# Patient Record
Sex: Female | Born: 1990 | Race: Black or African American | Hispanic: No | Marital: Single | Smoking: Never smoker
Health system: Southern US, Community
[De-identification: ages and names within clinical notes are randomized; demographics above are authoritative.]

## PROBLEM LIST (undated history)

## (undated) ENCOUNTER — Emergency Department (HOSPITAL_COMMUNITY): Payer: BC Managed Care – PPO

## (undated) DIAGNOSIS — K859 Acute pancreatitis without necrosis or infection, unspecified: Secondary | ICD-10-CM

## (undated) DIAGNOSIS — J45909 Unspecified asthma, uncomplicated: Secondary | ICD-10-CM

---

## 2019-02-10 ENCOUNTER — Encounter (HOSPITAL_COMMUNITY): Payer: Self-pay

## 2019-02-10 ENCOUNTER — Other Ambulatory Visit: Payer: Self-pay

## 2019-02-10 ENCOUNTER — Emergency Department (HOSPITAL_COMMUNITY): Payer: Managed Care, Other (non HMO)

## 2019-02-10 DIAGNOSIS — R002 Palpitations: Secondary | ICD-10-CM | POA: Diagnosis present

## 2019-02-10 DIAGNOSIS — Z79899 Other long term (current) drug therapy: Secondary | ICD-10-CM | POA: Insufficient documentation

## 2019-02-10 DIAGNOSIS — F10230 Alcohol dependence with withdrawal, uncomplicated: Secondary | ICD-10-CM | POA: Insufficient documentation

## 2019-02-10 DIAGNOSIS — J45909 Unspecified asthma, uncomplicated: Secondary | ICD-10-CM | POA: Insufficient documentation

## 2019-02-10 LAB — CBC
HCT: 42.1 % (ref 36.0–46.0)
Hemoglobin: 14.1 g/dL (ref 12.0–15.0)
MCH: 34.1 pg — ABNORMAL HIGH (ref 26.0–34.0)
MCHC: 33.5 g/dL (ref 30.0–36.0)
MCV: 101.9 fL — ABNORMAL HIGH (ref 80.0–100.0)
Platelets: 288 10*3/uL (ref 150–400)
RBC: 4.13 MIL/uL (ref 3.87–5.11)
RDW: 14.1 % (ref 11.5–15.5)
WBC: 5 10*3/uL (ref 4.0–10.5)
nRBC: 0 % (ref 0.0–0.2)

## 2019-02-10 LAB — I-STAT BETA HCG BLOOD, ED (NOT ORDERABLE): I-stat hCG, quantitative: <5 m[IU]/mL (ref <5)

## 2019-02-10 MED ORDER — SODIUM CHLORIDE 0.9% FLUSH: 3.0000 mL | Freq: Once | INTRAVENOUS | Status: DC

## 2019-02-10 NOTE — ED Triage Notes (Signed)
Pt complains of an elevated heart rate for several hours, she states before it was go away She's not been able to eat and having a hard time swallowing

## 2019-02-11 ENCOUNTER — Emergency Department (HOSPITAL_COMMUNITY)
Admission: EM | Admit: 2019-02-11 | Discharge: 2019-02-11 | Disposition: A | Discharge status: Home / Self Care | Payer: Managed Care, Other (non HMO) | Attending: Emergency Medicine | Admitting: Emergency Medicine

## 2019-02-11 DIAGNOSIS — F10930 Alcohol use, unspecified with withdrawal, uncomplicated: Secondary | ICD-10-CM

## 2019-02-11 DIAGNOSIS — R002 Palpitations: Secondary | ICD-10-CM

## 2019-02-11 DIAGNOSIS — F10230 Alcohol dependence with withdrawal, uncomplicated: Secondary | ICD-10-CM

## 2019-02-11 HISTORY — DX: Acute pancreatitis without necrosis or infection, unspecified: K85.90

## 2019-02-11 HISTORY — DX: Unspecified asthma, uncomplicated: J45.909

## 2019-02-11 LAB — BASIC METABOLIC PANEL
Anion gap: 15 (ref 5–15)
BUN: 7 mg/dL (ref 6–20)
CO2: 24 mmol/L (ref 22–32)
Calcium: 9.9 mg/dL (ref 8.9–10.3)
Chloride: 95 mmol/L — ABNORMAL LOW (ref 98–111)
Creatinine, Ser: 0.62 mg/dL (ref 0.44–1.00)
GFR calc Af Amer: >60 mL/min (ref >60)
GFR calc non Af Amer: >60 mL/min (ref >60)
Glucose, Bld: 98 mg/dL (ref 70–99)
Potassium: 3.9 mmol/L (ref 3.5–5.1)
Sodium: 134 mmol/L — ABNORMAL LOW (ref 135–145)

## 2019-02-11 LAB — LIPASE, BLOOD: Lipase: 21 U/L (ref 11–51)

## 2019-02-11 LAB — TROPONIN I (HIGH SENSITIVITY)
Troponin I (High Sensitivity): <2 ng/L (ref <18)
Troponin I (High Sensitivity): <2 ng/L (ref <18)

## 2019-02-11 MED ORDER — KETOROLAC TROMETHAMINE 30 MG/ML IJ SOLN
30.0000 mg | Freq: Once | INTRAMUSCULAR | Status: AC
  Administered 2019-02-11: 30 mg via INTRAVENOUS
  Filled 2019-02-11: qty 1

## 2019-02-11 MED ORDER — LORAZEPAM 2 MG/ML IJ SOLN
1.0000 mg | Freq: Once | INTRAMUSCULAR | Status: AC
  Administered 2019-02-11: 1 mg via INTRAVENOUS
  Filled 2019-02-11: qty 1

## 2019-02-11 MED ORDER — ALUM & MAG HYDROXIDE-SIMETH 200-200-20 MG/5ML PO SUSP
30.0000 mL | Freq: Once | ORAL | Status: AC
  Administered 2019-02-11: 30 mL via ORAL
  Filled 2019-02-11: qty 30

## 2019-02-11 MED ORDER — LORAZEPAM 1 MG PO TABS: ORAL_TABLET | ORAL | 0 refills | Status: AC

## 2019-02-11 NOTE — ED Triage Notes (Signed)
Pt tells RN and EMT that she's a heavy drinker and her last drink was at 8am yesterday

## 2019-02-11 NOTE — ED Notes (Signed)
Pt also complains of abdominal pain and has a hx of pancreatitis and requested her lipase be checked, pt stil has her gall bladder and also a hx of gallstones

## 2019-02-11 NOTE — ED Provider Notes (Signed)
[facility]GENCY DEPT Provider Note   CSN: [id number] Arrival date & time: 02/10/19  2239     History Chief Complaint - palpitations  Kim Wu is a 29 y.o. female.  The history is provided by the patient.  Palpitations Onset quality:  Sudden Duration:  1 day Timing:  Constant Progression:  Improving Chronicity:  New Relieved by:  None tried Worsened by:  Nothing Associated symptoms: nausea and shortness of breath   Patient w/history of asthma, alcohol abuse presents with multiple complaints.  She reports over the past day she has felt her heart racing and some chest tightness.  She also feels out of breath.  She is also had left shoulder pain and left foot pain.  She reports she feels that her body is pulsating.  No fevers or vomiting but she has had nausea.  She is also reporting mild headaches.  She is also had cough.  Patient admits to daily alcohol use, last drink was 8 AM on January 5. No trauma to explain the pain in extremities No history of CAD/VTE    Past Medical History:  Diagnosis Date  . Asthma   . Pancreatitis     There are no problems to display for this patient.   History reviewed. No pertinent surgical history.   OB History   No obstetric history on file.     History reviewed. No pertinent family history.  Social History   Tobacco Use  . Smoking status: Never Smoker  . Smokeless tobacco: Never Used  Substance Use Topics  . Alcohol use: Never  . Drug use: Never    Home Medications Prior to Admission medications   Medication Sig Start Date End Date Taking? Authorizing Provider  omeprazole (PRILOSEC) 40 MG capsule Take 40 mg by mouth daily.   Yes [provider]  LORazepam (ATIVAN) 1 MG tablet 3 tablets PO on day one, 2 tablets PO on day 2, 1 tablet PO on day 3 then STOP 02/11/19   Zadie Rhine, MD    Allergies    Other, Shellfish allergy, and Shellfish-derived products  Review of Systems   Review  of Systems  Constitutional: Negative for fever.  Respiratory: Positive for shortness of breath.   Cardiovascular: Positive for palpitations.  Gastrointestinal: Positive for nausea.  Neurological: Positive for headaches.  Psychiatric/Behavioral: The patient is nervous/anxious.   All other systems reviewed and are negative.   Physical Exam Updated Vital Signs BP 118/87   Pulse 97   Temp 98.8 F (37.1 C) (Oral)   Resp 16   Ht 1.803 m (5\' 11" )   Wt 73.5 kg   SpO2 99%   BMI 22.59 kg/m   Physical Exam CONSTITUTIONAL: Well developed/well nourished, anxious but in no distress watching a movie HEAD: Normocephalic/atraumatic EYES: EOMI/PERRL ENMT: Mucous membranes moist NECK: supple no meningeal signs SPINE/BACK:entire spine nontender CV: S1/S2 noted, no murmurs/rubs/gallops noted LUNGS: Lungs are clear to auscultation bilaterally, no apparent distress ABDOMEN: soft, nontender, no rebound or guarding, bowel sounds noted throughout abdomen GU:no cva tenderness NEURO: Pt is awake/alert/appropriate, moves all extremitiesx4.  No facial droop.  No tremors noted EXTREMITIES: pulses normal/equal, full ROM, mild tenderness to left shoulder, no deformities.  Full range of motion of all extremities.  There is no calf tenderness or edema.  Distal pulses equal and intact in all 4 extremities.  Mild tenderness to the plantar surface of the left foot.  No signs of trauma. SKIN: warm, color normal PSYCH: Mildly anxious  ED  Results / Procedures / Treatments   Labs (all labs ordered are listed, but only abnormal results are displayed) Labs Reviewed  BASIC METABOLIC PANEL - Abnormal; Notable for the following components:      Result Value   Sodium 134 (*)    Chloride 95 (*)    All other components within normal limits  CBC - Abnormal; Notable for the following components:   MCV 101.9 (*)    MCH 34.1 (*)    All other components within normal limits  LIPASE, BLOOD  I-STAT BETA HCG BLOOD, ED  ([facility], [facility], [facility] ONLY)  I-STAT BETA HCG BLOOD, ED (NOT ORDERABLE)  TROPONIN I (HIGH SENSITIVITY)  TROPONIN I (HIGH SENSITIVITY)    EKG EKG Interpretation  Date/Time:  Tuesday February 10 2019 22:53:17 EST Ventricular Rate:  113 PR Interval:    QRS Duration: 79 QT Interval:  316 QTC Calculation: 434 R Axis:   82 Text Interpretation: Sinus tachycardia Minimal ST depression, inferior leads No previous ECGs available Confirmed by Ripley Fraise [phone]) on 02/11/2019 4:59:57 AM   Radiology DG Chest 2 View  Result Date: 02/10/2019 CLINICAL DATA:  Tachycardia. EXAM: CHEST - 2 VIEW COMPARISON:  None. FINDINGS: The heart size and mediastinal contours are within normal limits. Both lungs are clear. The visualized skeletal structures are unremarkable. IMPRESSION: No active cardiopulmonary disease. Electronically Signed   By: Virgina Norfolk M.D.   On: 02/10/2019 23:25    Procedures Procedures  Medications Ordered in ED Medications  sodium chloride flush (NS) 0.9 % injection 3 mL (0 mLs Intravenous Hold 02/11/19 0551)  ketorolac (TORADOL) 30 MG/ML injection 30 mg (30 mg Intravenous Given 02/11/19 0546)  LORazepam (ATIVAN) injection 1 mg (1 mg Intravenous Given 02/11/19 0547)  alum & mag hydroxide-simeth (MAALOX/MYLANTA) 200-200-20 MG/5ML suspension 30 mL (30 mLs Oral Given 02/11/19 8841)    ED Course  I have reviewed the triage vital signs and the nursing notes.  Pertinent labs & imaging results that were available during my care of the patient were reviewed by me and considered in my medical decision making (see chart for details).    MDM Rules/Calculators/A&P                      Patient presents for multiple complaints, but I suspect the part of her issue is due to alcohol withdrawal.  This is a very mild form of alcohol withdrawal.  Will give a dose of Ativan here.  She is also having arthralgias that are reproducible, but no signs of trauma.  No signs of DVT.  Low suspicion for ACS/PE as  cause of her palpitations.  Patient is here visiting family, she actually lives in [place] She plans to head back home this weekend.  She would be appropriate for outpatient management of alcohol withdrawal, short course of Ativan be ordered. 6:29 AM Stable no acute distress.  Vitals are appropriate. BP 126/79   Pulse 95   Temp 98.8 F (37.1 C) (Oral)   Resp 14   Ht 1.803 m (5\' 11" )   Wt 73.5 kg   SpO2 100%   BMI 22.59 kg/m  She reports continued pain mostly anterior left shoulder that is point tender.  No deformities. she can fully range her shoulder.  Unclear cause of arthralgias but she can follow-up when she gets back to the past.  We will start Ativan taper, and counseled on cessation of alcohol   Final Clinical Impression(s) / ED Diagnoses Final diagnoses:  Palpitations  Alcohol withdrawal syndrome without complication (HCC)    Rx / DC Orders ED Discharge Orders         Ordered    LORazepam (ATIVAN) 1 MG tablet     02/11/19 0545           Zadie Rhine, MD 02/11/19 0630

## 2021-07-06 IMAGING — CR DG CHEST 2V
2 series · 2 of 2 positions shown · non-contrast
Comparison: None.

CLINICAL DATA: Tachycardia.

EXAM:
CHEST - 2 VIEW

[w chest pa]
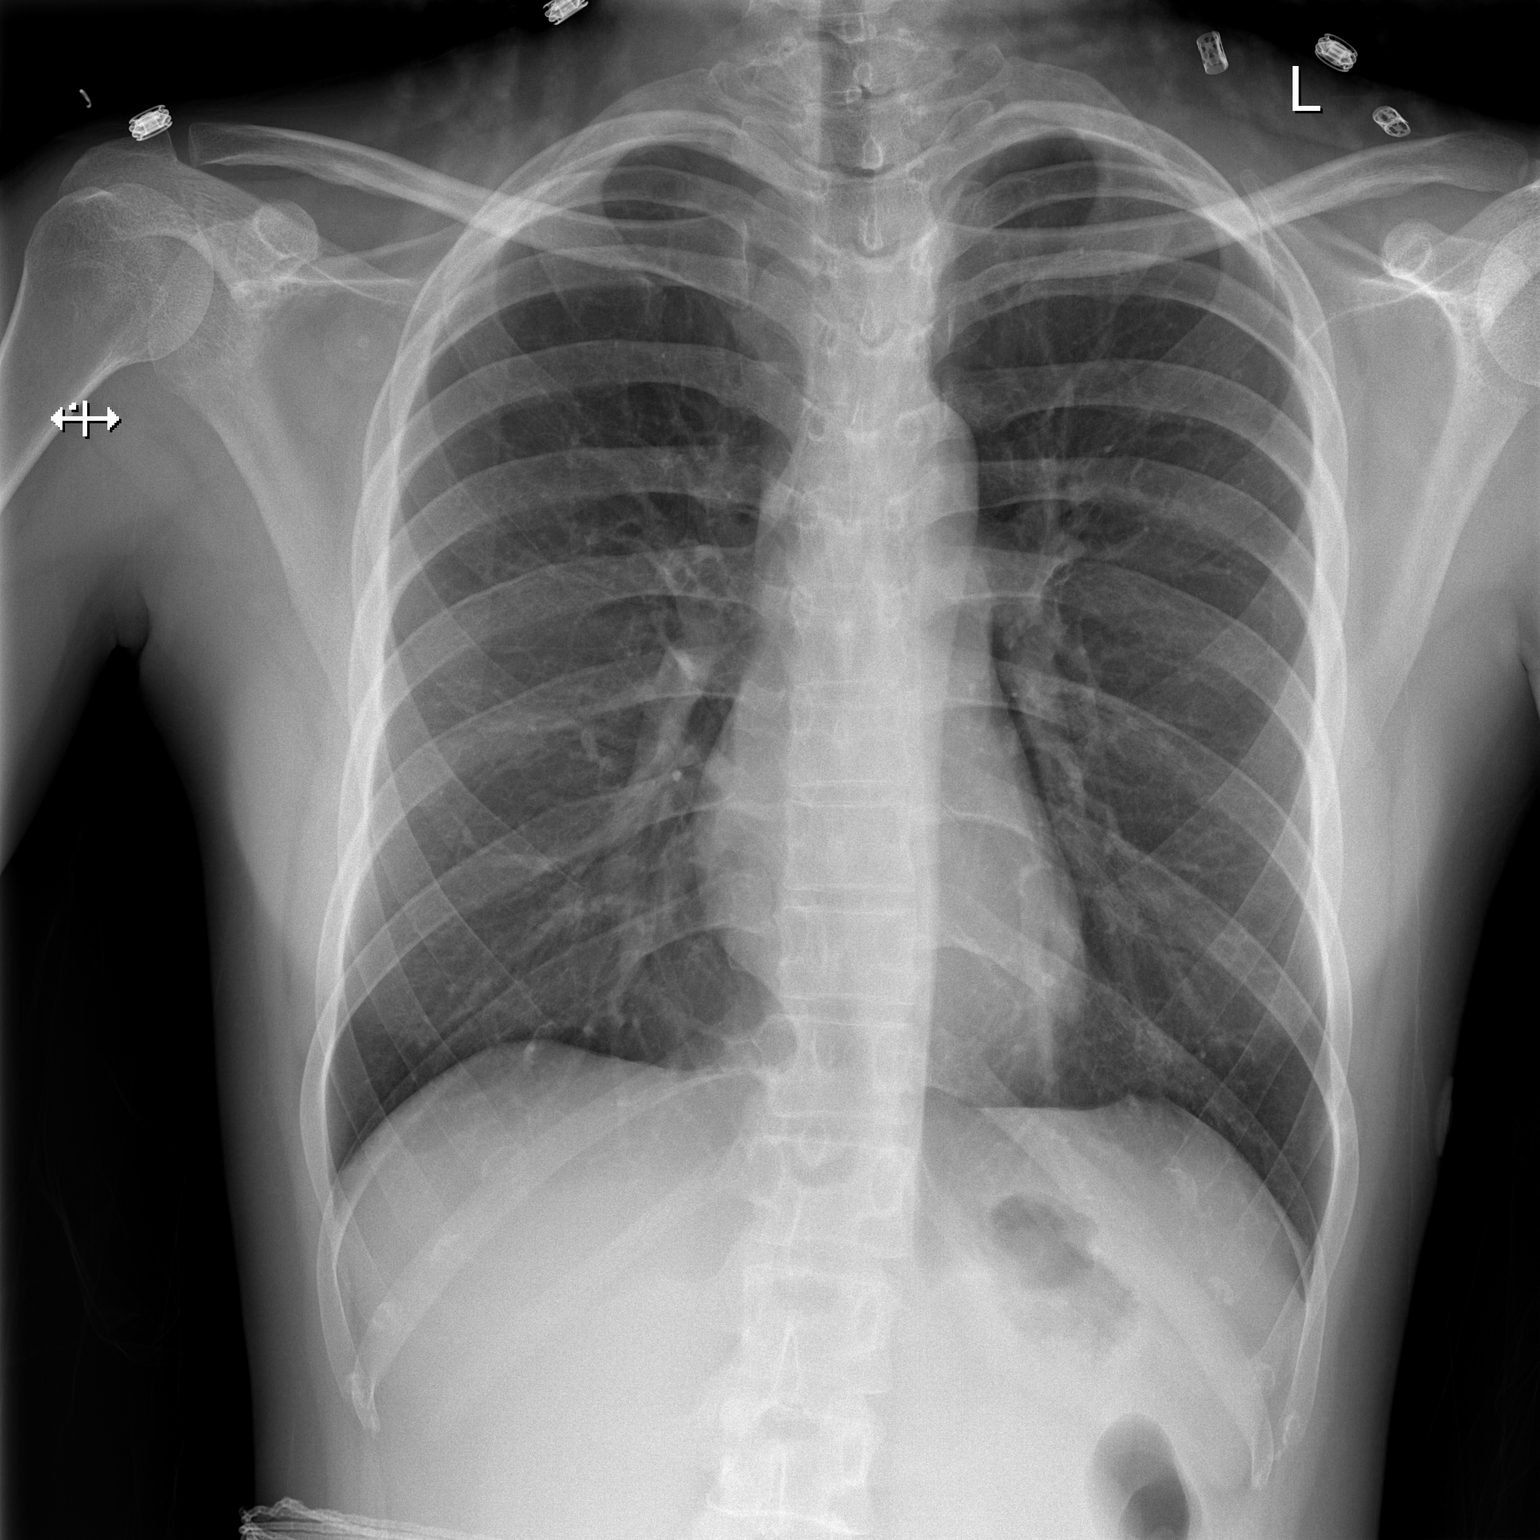

[w chest lat]
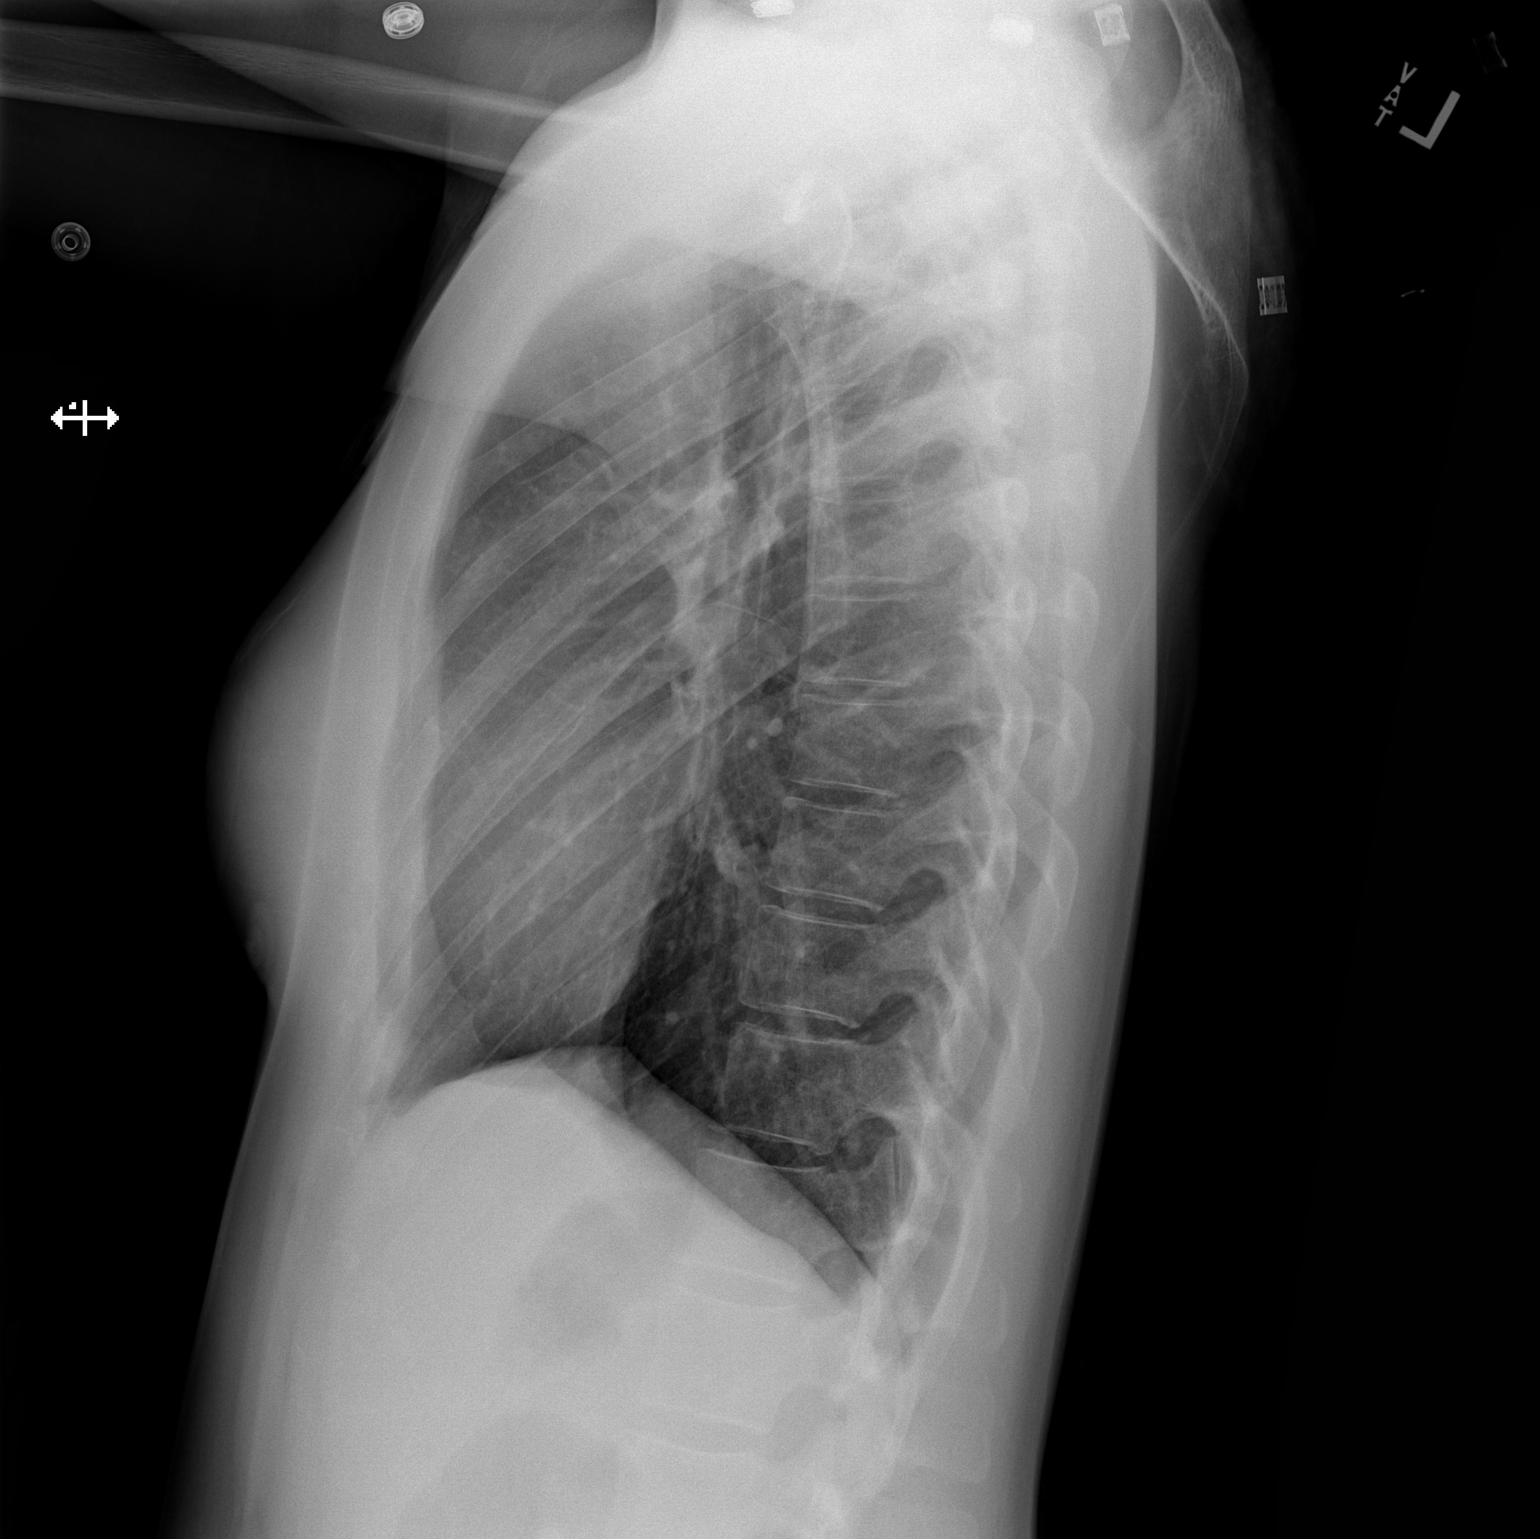

[2 of 2 positions shown; findings below may reference images not displayed]

FINDINGS: The heart size and mediastinal contours are within normal limits.
Both lungs are clear. The visualized skeletal structures are
unremarkable.
IMPRESSION: No active cardiopulmonary disease.

## 2023-02-04 ENCOUNTER — Encounter (HOSPITAL_COMMUNITY): Payer: Self-pay

## 2023-02-04 ENCOUNTER — Other Ambulatory Visit: Payer: Self-pay

## 2023-02-04 ENCOUNTER — Emergency Department (HOSPITAL_COMMUNITY)
Admission: EM | Admit: 2023-02-04 | Discharge: 2023-02-05 | Discharge status: Against Medical Advice | Payer: BC Managed Care – PPO | Attending: Medical | Admitting: Medical

## 2023-02-04 DIAGNOSIS — R109 Unspecified abdominal pain: Secondary | ICD-10-CM | POA: Diagnosis present

## 2023-02-04 DIAGNOSIS — Z5321 Procedure and treatment not carried out due to patient leaving prior to being seen by health care provider: Secondary | ICD-10-CM | POA: Insufficient documentation

## 2023-02-04 DIAGNOSIS — R197 Diarrhea, unspecified: Secondary | ICD-10-CM | POA: Diagnosis not present

## 2023-02-04 DIAGNOSIS — R112 Nausea with vomiting, unspecified: Secondary | ICD-10-CM | POA: Diagnosis not present

## 2023-02-04 LAB — URINALYSIS, ROUTINE W REFLEX MICROSCOPIC
Bacteria, UA: NONE SEEN
Bilirubin Urine: NEGATIVE
Glucose, UA: NEGATIVE mg/dL
Hgb urine dipstick: NEGATIVE
Ketones, ur: 20 mg/dL — AB
Nitrite: NEGATIVE
Protein, ur: 100 mg/dL — AB
Specific Gravity, Urine: 1.02 (ref 1.005–1.030)
pH: 9 — ABNORMAL HIGH (ref 5.0–8.0)

## 2023-02-04 LAB — CBC
HCT: 42.1 % (ref 36.0–46.0)
Hemoglobin: 14.6 g/dL (ref 12.0–15.0)
MCH: 32.6 pg (ref 26.0–34.0)
MCHC: 34.7 g/dL (ref 30.0–36.0)
MCV: 94 fL (ref 80.0–100.0)
Platelets: 386 10*3/uL (ref 150–400)
RBC: 4.48 MIL/uL (ref 3.87–5.11)
RDW: 12.5 % (ref 11.5–15.5)
WBC: 8.1 10*3/uL (ref 4.0–10.5)
nRBC: 0 % (ref 0.0–0.2)

## 2023-02-04 MED ORDER — ONDANSETRON HCL 4 MG/2ML IJ SOLN
4.0000 mg | Freq: Once | INTRAMUSCULAR | Status: AC | PRN
  Administered 2023-02-04: 4 mg via INTRAVENOUS
  Filled 2023-02-04: qty 2

## 2023-02-04 NOTE — ED Triage Notes (Signed)
Pt POV fromhome d/t N/V/D with ABD pain since Tuesday - does not know which came first.  Mid ABD pain.

## 2023-02-05 LAB — COMPREHENSIVE METABOLIC PANEL
ALT: 31 U/L (ref 0–44)
AST: 25 U/L (ref 15–41)
Albumin: 4.5 g/dL (ref 3.5–5.0)
Alkaline Phosphatase: 56 U/L (ref 38–126)
Anion gap: 12 (ref 5–15)
BUN: 5 mg/dL — ABNORMAL LOW (ref 6–20)
CO2: 17 mmol/L — ABNORMAL LOW (ref 22–32)
Calcium: 9.6 mg/dL (ref 8.9–10.3)
Chloride: 103 mmol/L (ref 98–111)
Creatinine, Ser: 0.63 mg/dL (ref 0.44–1.00)
GFR, Estimated: >60 mL/min (ref >60)
Glucose, Bld: 132 mg/dL — ABNORMAL HIGH (ref 70–99)
Potassium: 3.7 mmol/L (ref 3.5–5.1)
Sodium: 132 mmol/L — ABNORMAL LOW (ref 135–145)
Total Bilirubin: 0.7 mg/dL (ref 0.0–1.2)
Total Protein: 8.3 g/dL — ABNORMAL HIGH (ref 6.5–8.1)

## 2023-02-05 LAB — HCG, SERUM, QUALITATIVE: Preg, Serum: NEGATIVE

## 2023-02-05 LAB — LIPASE, BLOOD: Lipase: 26 U/L (ref 11–51)

## 2023-02-05 NOTE — ED Notes (Signed)
Patient stated she did not want to wait any longer, IV removed, patient pulled OTF
